# Patient Record
Sex: Male | Born: 1955 | Race: White | Hispanic: No | Marital: Married
Health system: Southern US, Community
[De-identification: ages and names within clinical notes are randomized; demographics above are authoritative.]

---

## 2019-10-04 ENCOUNTER — Other Ambulatory Visit: Payer: Self-pay

## 2019-10-04 ENCOUNTER — Emergency Department (HOSPITAL_COMMUNITY)
Admission: EM | Admit: 2019-10-04 | Discharge: 2019-10-05 | Disposition: A | Discharge status: Home / Self Care | Payer: BC Managed Care – PPO | Attending: Emergency Medicine | Admitting: Emergency Medicine

## 2019-10-04 DIAGNOSIS — Y9389 Activity, other specified: Secondary | ICD-10-CM | POA: Diagnosis not present

## 2019-10-04 DIAGNOSIS — Y929 Unspecified place or not applicable: Secondary | ICD-10-CM | POA: Insufficient documentation

## 2019-10-04 DIAGNOSIS — Y999 Unspecified external cause status: Secondary | ICD-10-CM | POA: Diagnosis not present

## 2019-10-04 DIAGNOSIS — X58XXXA Exposure to other specified factors, initial encounter: Secondary | ICD-10-CM | POA: Diagnosis not present

## 2019-10-04 DIAGNOSIS — T18128A Food in esophagus causing other injury, initial encounter: Secondary | ICD-10-CM | POA: Diagnosis not present

## 2019-10-04 DIAGNOSIS — R0989 Other specified symptoms and signs involving the circulatory and respiratory systems: Secondary | ICD-10-CM | POA: Diagnosis present

## 2019-10-04 MED ORDER — GLUCAGON HCL RDNA (DIAGNOSTIC) 1 MG IJ SOLR
1.0000 mg | Freq: Once | INTRAMUSCULAR | Status: AC
  Administered 2019-10-04: 1 mg via INTRAVENOUS
  Filled 2019-10-04: qty 1

## 2019-10-04 MED ORDER — ONDANSETRON HCL 4 MG/2ML IJ SOLN
4.0000 mg | Freq: Once | INTRAMUSCULAR | Status: AC
  Administered 2019-10-04: 4 mg via INTRAVENOUS
  Filled 2019-10-04: qty 2

## 2019-10-04 MED ORDER — STERILE WATER FOR INJECTION IJ SOLN
INTRAMUSCULAR | Status: AC
  Filled 2019-10-04: qty 10

## 2019-10-04 NOTE — ED Triage Notes (Signed)
Pt reports that he feels like he has had something stuck in his throat since lunch today. Reports that he is unable to keep down water. Denies SOB. A&Ox4.

## 2019-10-04 NOTE — ED Provider Notes (Signed)
[facility]-EMERGENCY DEPT Provider Note   CSN: [id number] Arrival date & time: 10/04/19  2045     History Chief Complaint  Patient presents with  . Food Bolus    Andrew Barrera is a 64 y.o. male.  The history is provided by the patient and medical records.    64 y.o. M presenting to the ED for food impaction.  States he ate steak at lunch today and feels like there is a piece stuck in his throat.  States he has had this happen before but usually is able to get it to pass with relaxation and drinking water.  He has tried that today without success, states the water gets stuck and causes spasms making him vomit.  He states he is starting to feel quite miserable.  Denies difficulty breathing, drooling, etc.  He has been told he may have esophageal stricture in the past but has never had it treated.  No past medical history on file.  There are no problems to display for this patient.      No family history on file.  Social History   Tobacco Use  . Smoking status: Not on file  Substance Use Topics  . Alcohol use: Not on file  . Drug use: Not on file    Home Medications Prior to Admission medications   Not on File    Allergies    Patient has no known allergies.  Review of Systems   Review of Systems  Gastrointestinal:       Food impaction  All other systems reviewed and are negative.   Physical Exam Updated Vital Signs BP (!) 147/90 (BP Location: Left Arm)   Pulse 65   Temp 98.2 F (36.8 C) (Oral)   Resp 16   SpO2 100%   Physical Exam Vitals and nursing note reviewed.  Constitutional:      Appearance: He is well-developed.  HENT:     Head: Normocephalic and atraumatic.     Mouth/Throat:     Comments: Oropharynx clear, no drooling, no stridor Eyes:     Conjunctiva/sclera: Conjunctivae normal.     Pupils: Pupils are equal, round, and reactive to light.  Cardiovascular:     Rate and Rhythm: Normal rate and regular rhythm.   Heart sounds: Normal heart sounds.  Pulmonary:     Effort: Pulmonary effort is normal. No respiratory distress.     Breath sounds: Normal breath sounds. No rhonchi.  Abdominal:     General: Bowel sounds are normal.     Palpations: Abdomen is soft.     Tenderness: There is no abdominal tenderness. There is no rebound.  Musculoskeletal:        General: Normal range of motion.     Cervical back: Normal range of motion.  Skin:    General: Skin is warm and dry.  Neurological:     Mental Status: He is alert and oriented to person, place, and time.     ED Results / Procedures / Treatments   Labs (all labs ordered are listed, but only abnormal results are displayed) Labs Reviewed - No data to display  EKG None  Radiology No results found.  Procedures Procedures (including critical care time)  Medications Ordered in ED Medications  glucagon (human recombinant) (GLUCAGEN) injection 1 mg (1 mg Intravenous Given 10/04/19 2314)  ondansetron (ZOFRAN) injection 4 mg (4 mg Intravenous Given 10/04/19 2311)    ED Course  I have reviewed the triage vital signs and the  nursing notes.  Pertinent labs & imaging results that were available during my care of the patient were reviewed by me and considered in my medical decision making (see chart for details).    MDM Rules/Calculators/A&P    64 year old male presenting to the ED with food impaction.  States he ate steak around lunch and feels like there is a piece stuck in his throat.  States he has become more uncomfortable as the day went on.  He states he is continue trying to drink water, however vomits it up immediately after.  He reports history of the same, usually is able to get this to resolve on its own.  He has never been evaluated by GI or had esophageal dilation.  He is afebrile and nontoxic.  Oropharynx is clear, no drooling or stridor.  Denies any shortness of breath.  Will give trial of glucagon and Zofran.  12:08 AM After  glucagon and Zofran, patient feels like food bolus has moved down.  He has been able to tolerate a full cup of water here without issue.  States he feels much more comfortable and no longer feels foreign body sensation in the throat.  He is ready to go home.  He will be given GI follow-up.  Encouraged small bites, lots of fluids when eating over the next several days.  He may return here for any new or acute changes.  Final Clinical Impression(s) / ED Diagnoses Final diagnoses:  Food impaction of esophagus, initial encounter    Rx / DC Orders ED Discharge Orders    None       Larene Pickett, PA-C 10/05/19 0013    Davonna Belling, MD 10/05/19 [phone]

## 2019-10-04 NOTE — ED Notes (Signed)
ED Provider at bedside. 

## 2019-10-05 NOTE — Discharge Instructions (Signed)
Over the next several days, eat small bites/meals and drink lots of fluids while eating. Follow-up with GI specialist if any ongoing issues--can call for appt Return here for any new/acute changes.

## 2019-11-09 ENCOUNTER — Other Ambulatory Visit: Payer: Self-pay | Admitting: Physician Assistant

## 2019-11-09 DIAGNOSIS — R1310 Dysphagia, unspecified: Secondary | ICD-10-CM

## 2019-11-16 ENCOUNTER — Other Ambulatory Visit: Payer: BC Managed Care – PPO

## 2019-11-27 ENCOUNTER — Ambulatory Visit
Admission: RE | Admit: 2019-11-27 | Discharge: 2019-11-27 | Disposition: A | Discharge status: Home / Self Care | Payer: BC Managed Care – PPO | Source: Ambulatory Visit | Attending: Physician Assistant | Admitting: Physician Assistant

## 2019-11-27 DIAGNOSIS — R1310 Dysphagia, unspecified: Secondary | ICD-10-CM

## 2020-04-02 ENCOUNTER — Ambulatory Visit: Payer: BC Managed Care – PPO | Admitting: Podiatry

## 2020-04-14 ENCOUNTER — Ambulatory Visit: Payer: BC Managed Care – PPO | Admitting: Podiatry

## 2020-04-16 ENCOUNTER — Encounter: Payer: Self-pay | Admitting: Podiatry

## 2020-04-16 ENCOUNTER — Other Ambulatory Visit: Payer: Self-pay | Admitting: Podiatry

## 2020-04-16 ENCOUNTER — Ambulatory Visit (INDEPENDENT_AMBULATORY_CARE_PROVIDER_SITE_OTHER): Payer: BC Managed Care – PPO

## 2020-04-16 ENCOUNTER — Other Ambulatory Visit: Payer: Self-pay

## 2020-04-16 ENCOUNTER — Ambulatory Visit (INDEPENDENT_AMBULATORY_CARE_PROVIDER_SITE_OTHER): Payer: BC Managed Care – PPO | Admitting: Podiatry

## 2020-04-16 DIAGNOSIS — M722 Plantar fascial fibromatosis: Secondary | ICD-10-CM

## 2020-04-16 DIAGNOSIS — L600 Ingrowing nail: Secondary | ICD-10-CM | POA: Diagnosis not present

## 2020-04-16 DIAGNOSIS — M79671 Pain in right foot: Secondary | ICD-10-CM | POA: Diagnosis not present

## 2020-04-16 MED ORDER — NEOMYCIN-POLYMYXIN-HC 3.5-10000-1 OT SOLN: OTIC | 0 refills | Status: AC

## 2020-04-16 NOTE — Patient Instructions (Signed)

## 2020-04-16 NOTE — Progress Notes (Signed)
Subjective:   Patient ID: Andrew Barrera, male   DOB: 64 y.o.   MRN: [id number]   HPI Patient presents stating I have a nodule underneath my right arch has been present for a while but is gotten painful over the last 6 weeks and I have an ingrown toenail my left big toe that is been there for a number years and I cannot take care of.  Patient states he likes to be active and hike and does not smoke and states the pain is making it more difficult for him to do what he likes   Review of Systems  All other systems reviewed and are negative.       Objective:  Physical Exam Vitals and nursing note reviewed.  Constitutional:      Appearance: He is well-developed.  Pulmonary:     Effort: Pulmonary effort is normal.  Musculoskeletal:        General: Normal range of motion.  Skin:    General: Skin is warm.  Neurological:     Mental Status: He is alert.     Neurovascular status intact muscle strength adequate range of motion within normal limits.  Patient is found to have an approximate 1.5 cm x 1.5 cm nodule in the mid arch area right that appears to be attached into the plantar fascia.  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  Today I discussed the right I do not recommend excision currently since it is fairly new and will get a try steroid injection to shrink it and if it were to remain painful or grow in size or change color excision will be necessary.  I did speak sterile prep and injected 3 mg Kenalog 5 mg Xylocaine around the area.  For the left I recommended correction allowed him to read consent form for ingrown toenail procedure and he wants this done understanding  risk and signed consent form and today I infiltrated the left hallux 60 mg like Marcaine mixture sterile prep done and using sterile instrumentation remove the lateral border exposed matrix applied phenol 3 applications 30 seconds followed by alcohol lavage and sterile dressing and gave instructions for soaks and leave dressing on 24 hours but take it off earlier if throbbing were to occur and encouraged him to call with any questions concerns which may come off

## 2020-04-24 ENCOUNTER — Telehealth: Payer: Self-pay | Admitting: Nurse Practitioner

## 2020-04-24 NOTE — Telephone Encounter (Signed)
Called to Discuss with patient about Covid symptoms and the use of the monoclonal antibody infusion for those with mild to moderate Covid symptoms and at a high risk of hospitalization.     Pt appears to qualify for this infusion due to co-morbid conditions and/or a member of an at-risk group in accordance with the FDA Emergency Use Authorization.    Patient reports he is feeling much better, declines infusion.   Willette Alma, NP [facility]  [phone]

## 2020-06-12 ENCOUNTER — Encounter: Payer: Self-pay | Admitting: Podiatry

## 2020-06-12 ENCOUNTER — Ambulatory Visit (INDEPENDENT_AMBULATORY_CARE_PROVIDER_SITE_OTHER): Payer: BC Managed Care – PPO | Admitting: Podiatry

## 2020-06-12 ENCOUNTER — Other Ambulatory Visit: Payer: Self-pay

## 2020-06-12 DIAGNOSIS — L600 Ingrowing nail: Secondary | ICD-10-CM | POA: Diagnosis not present

## 2020-06-12 DIAGNOSIS — M722 Plantar fascial fibromatosis: Secondary | ICD-10-CM

## 2020-06-12 NOTE — Progress Notes (Signed)
Subjective:   Patient ID: Andrew Barrera, male   DOB: 65 y.o.   MRN: [id number]   HPI Patient presents stating that the nodules have shrunk a little bit and are not bothering but he is having some pain above this area and had questions concerning the long-term   ROS      Objective:  Physical Exam  Neurovascular status intact with patient's nodules on the plantar aspect of the right distal arch which measures around 1.5 x 1.5 cm are found to be present and are slightly flattened but are still quite exposed.  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  We again discussed surgery and I reviewed surgery and recovery along with distal fascial injection and other modalities we could consider.  He does not want surgery refusing this at the time understanding I cannot guarantee exactly what the type of lesions they are but most likely benign and does not want further injection but may be necessary someday.  Full education given to patient today

## 2021-02-20 IMAGING — RF DG ESOPHAGUS
5 series · 15 of 19 positions shown · non-contrast
Comparison: None.

CLINICAL DATA: Dysphagia.

EXAM:
ESOPHOGRAM / BARIUM SWALLOW / BARIUM TABLET STUDY
TECHNIQUE: Combined double contrast and single contrast examination performed
using effervescent crystals, thick barium liquid, and thin barium
liquid. The patient was observed with fluoroscopy swallowing a 13 mm
barium sulphate tablet.
FLUOROSCOPY TIME:  Fluoroscopy Time:  0 minutes 42 second
Radiation Exposure Index (if provided by the fluoroscopic device):
Number of Acquired Spot Images: 0

[Series 1: sequence · 0.28mm/px · 3 of 20 frames shown (1 of 5)]
[frame 4/20]
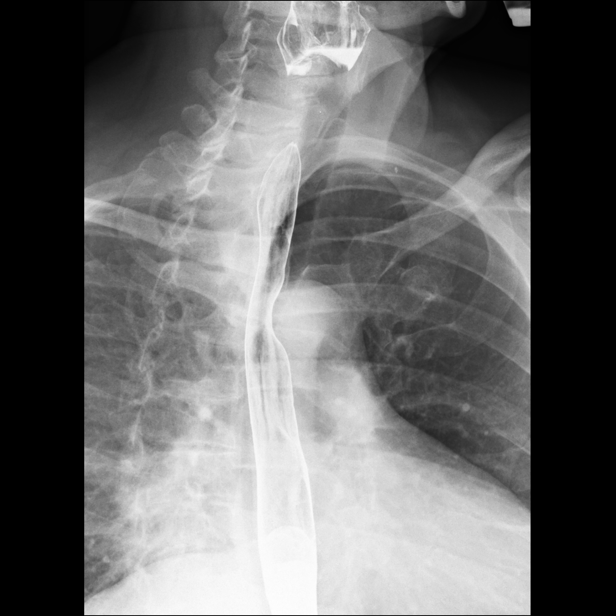
[frame 11/20]
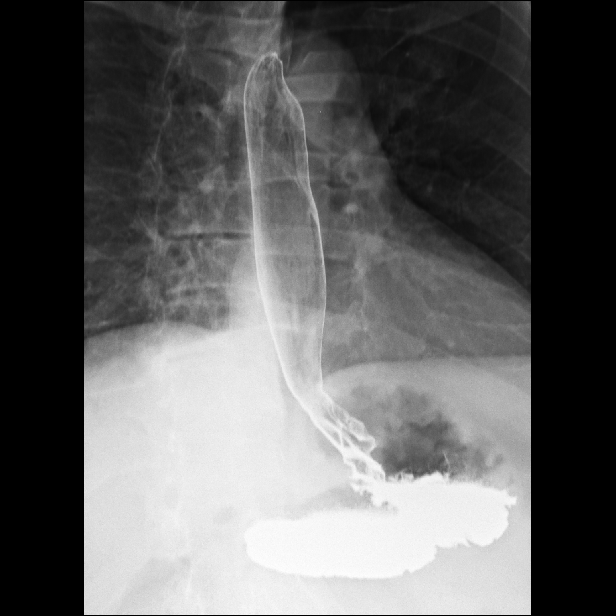
[frame 18/20]
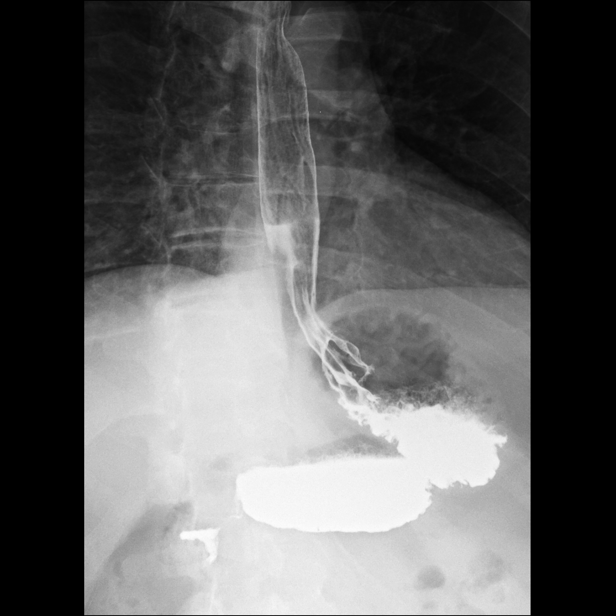

[Series 2: sequence · 0.28mm/px · 3 of 27 frames shown (2 of 5)]
[frame 5/27]
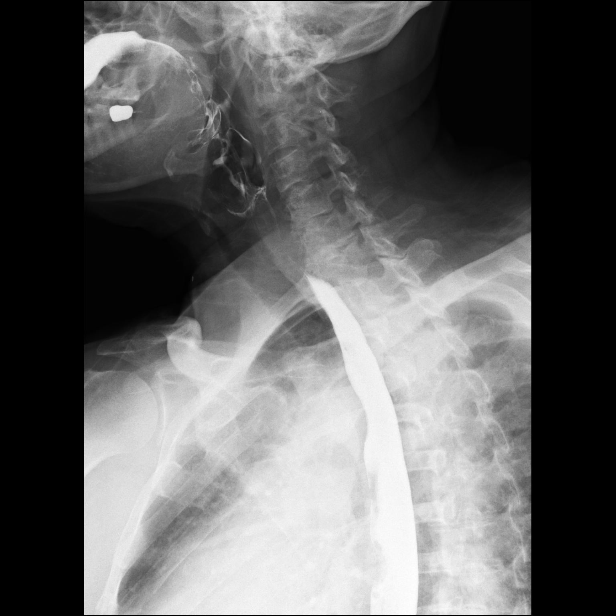
[frame 13/27]
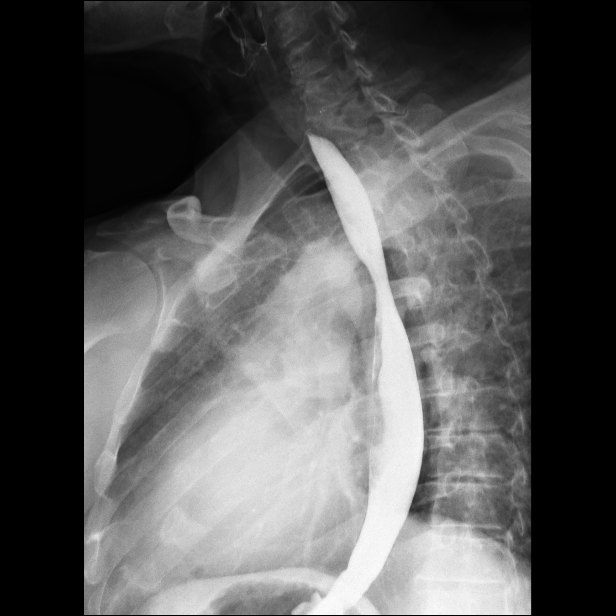
[frame 14/27]
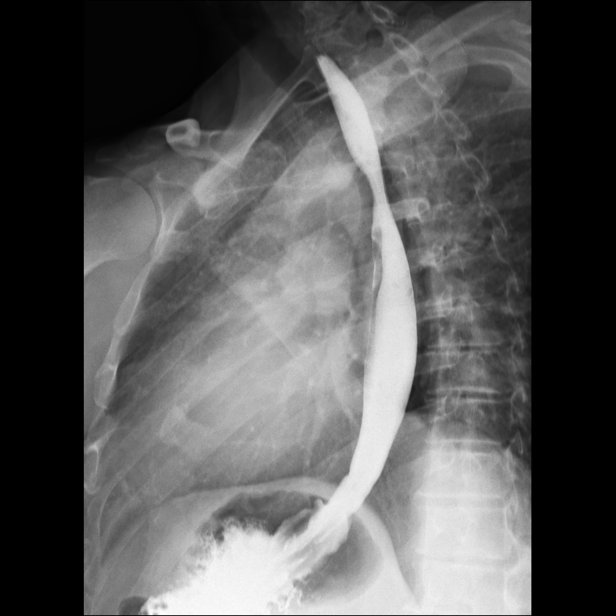

[Series 3: sequence · 0.28mm/px · 3 of 10 frames shown (3 of 5)]
[frame 2/10]
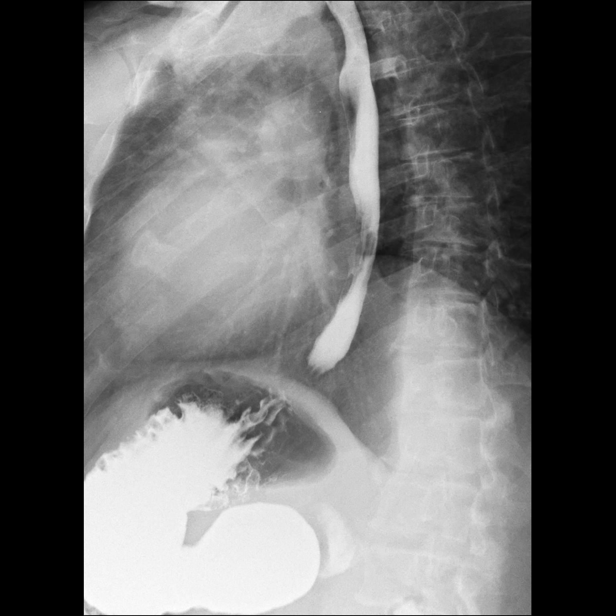
[frame 6/10]
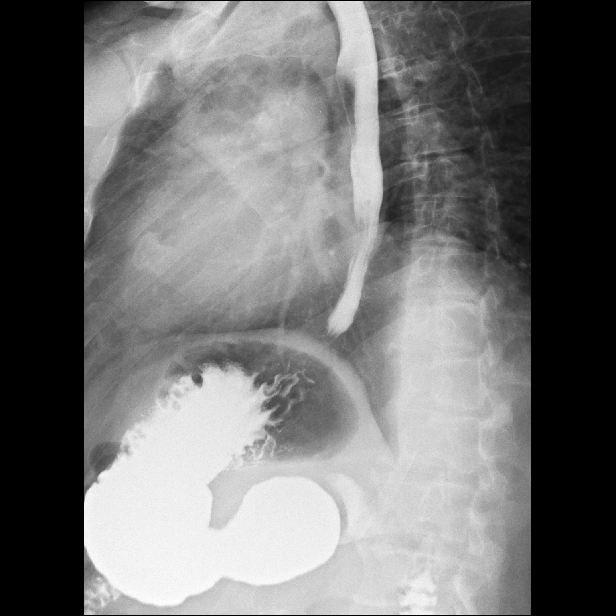
[frame 9/10]
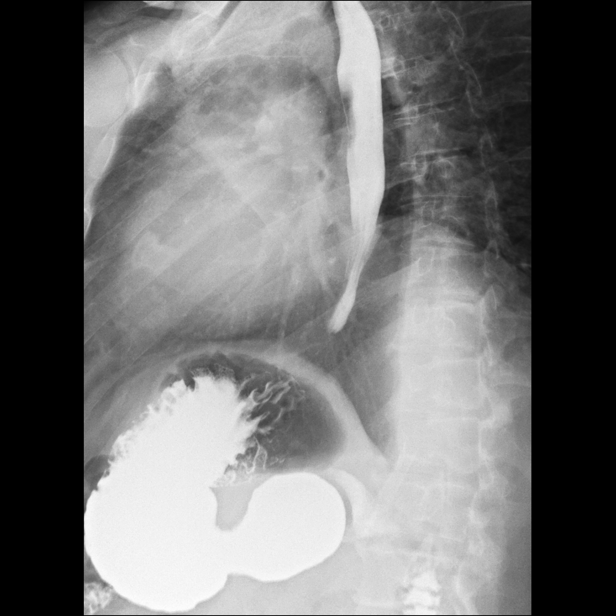

[Series 4: sequence · 0.28mm/px · 3 of 16 frames shown (4 of 5)]
[frame 3/16]
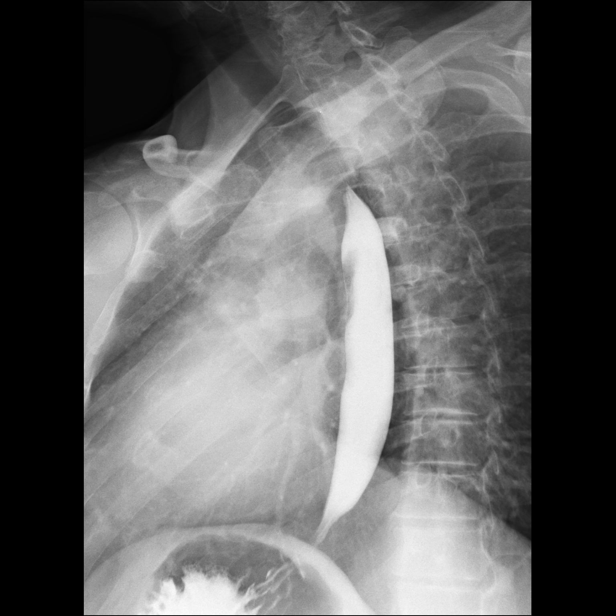
[frame 9/16]
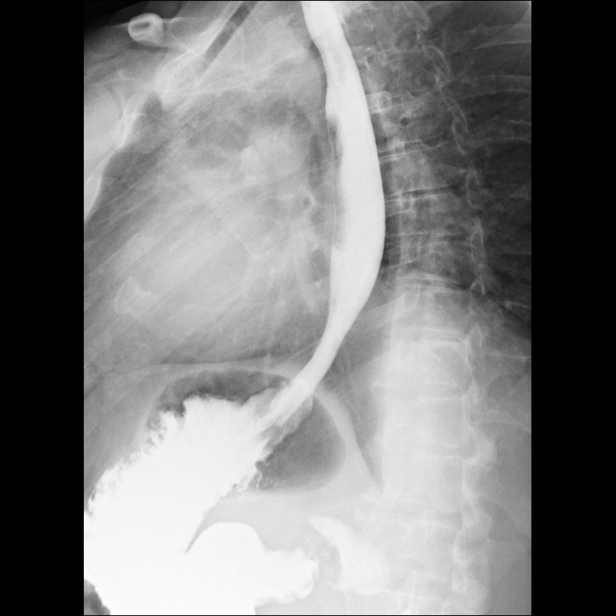
[frame 14/16]
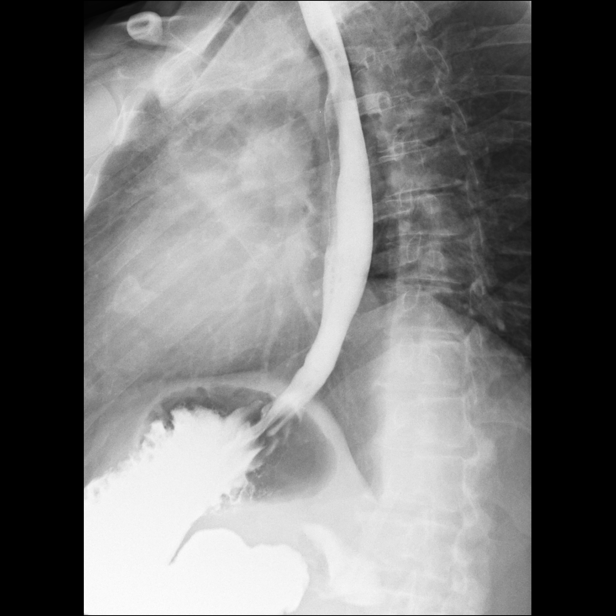

[Series 5: sequence · 3 of 34 frames shown (5 of 5)]
[frame 6/34]
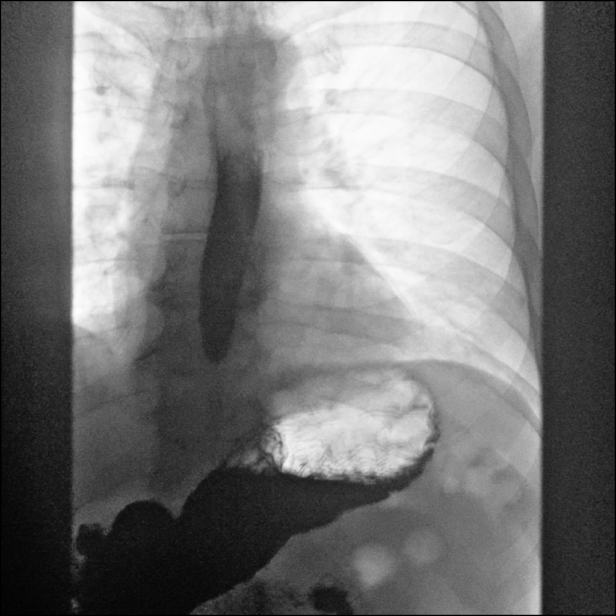
[frame 25/34]
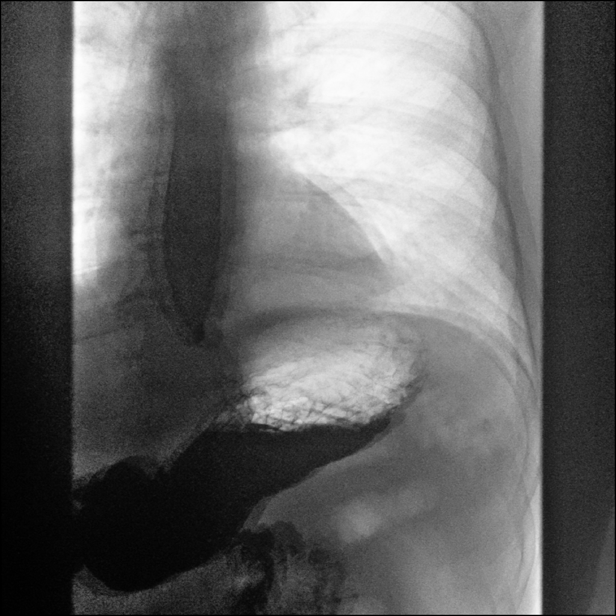
[frame 29/34]
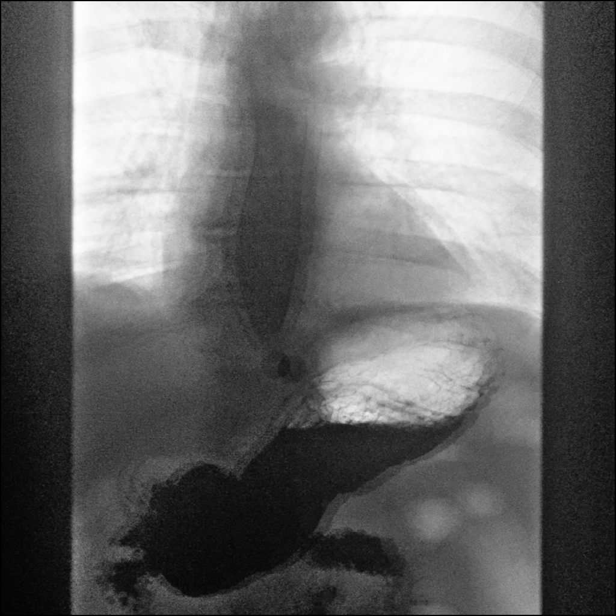

[15 of 19 positions shown; findings below may reference images not displayed]

FINDINGS: Pharyngeal phase of swallowing is normal. Esophageal mucosa and
motility normal. No stricture or mass.

Small hiatal hernia with mild gastroesophageal reflux.

Barium tablet passed readily into the stomach without delay.
IMPRESSION: Small hiatal hernia with mild reflux.  No stricture or mass.
